# Patient Record
Sex: Female | Born: 2005 | Race: White | Hispanic: No | Marital: Single | Smoking: Never smoker
Health system: Southern US, Community
[De-identification: ages and names within clinical notes are randomized; demographics above are authoritative.]

---
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
## 2024-03-20 ENCOUNTER — Other Ambulatory Visit: Payer: Self-pay | Admitting: Family Medicine

## 2024-03-22 ENCOUNTER — Other Ambulatory Visit: Payer: Self-pay

## 2024-03-22 ENCOUNTER — Other Ambulatory Visit: Payer: Self-pay | Admitting: Family Medicine

## 2024-03-22 DIAGNOSIS — Z3041 Encounter for surveillance of contraceptive pills: Secondary | ICD-10-CM

## 2024-03-22 MED ORDER — JUNEL FE 1/20 1-20 MG-MCG PO TABS: 1.0000 | ORAL_TABLET | Freq: Every day | ORAL | 0 refills | Status: AC

## 2024-03-22 NOTE — Telephone Encounter (Signed)
 Copied from CRM [id number]. Topic: Clinical - Prescription Issue >> Mar 22, 2024 12:59 PM Nessti S wrote: Reason for CRM: pt called about prescription norethindrone-ethinyl estradiol-FE (JUNEL  FE 1/20) 1-20 MG-MCG tablet. She stated that med was not received. She would like refill to go to  [id number], [place] - [address] [place] [address] Phone: [phone] Fax: [phone].  She would like a call back soon as possible

## 2024-04-25 ENCOUNTER — Encounter: Payer: Self-pay | Admitting: Obstetrics and Gynecology
